# Patient Record
Sex: Male | Born: 1965 | Race: Black or African American | Hispanic: No | Smoking: Never smoker
Health system: Southern US, Community
[De-identification: ages and names within clinical notes are randomized; demographics above are authoritative.]

---

## 2018-12-27 ENCOUNTER — Other Ambulatory Visit: Payer: Self-pay

## 2018-12-27 ENCOUNTER — Emergency Department (HOSPITAL_COMMUNITY)
Admission: EM | Admit: 2018-12-27 | Discharge: 2018-12-27 | Disposition: A | Discharge status: Home / Self Care | Payer: Medicare Other | Attending: Emergency Medicine | Admitting: Emergency Medicine

## 2018-12-27 ENCOUNTER — Encounter (HOSPITAL_COMMUNITY): Payer: Self-pay | Admitting: Student

## 2018-12-27 ENCOUNTER — Emergency Department (HOSPITAL_COMMUNITY): Payer: Medicare Other

## 2018-12-27 DIAGNOSIS — R0981 Nasal congestion: Secondary | ICD-10-CM | POA: Diagnosis present

## 2018-12-27 DIAGNOSIS — J0100 Acute maxillary sinusitis, unspecified: Secondary | ICD-10-CM | POA: Diagnosis not present

## 2018-12-27 DIAGNOSIS — I10 Essential (primary) hypertension: Secondary | ICD-10-CM | POA: Diagnosis not present

## 2018-12-27 DIAGNOSIS — K5900 Constipation, unspecified: Secondary | ICD-10-CM

## 2018-12-27 DIAGNOSIS — E119 Type 2 diabetes mellitus without complications: Secondary | ICD-10-CM | POA: Insufficient documentation

## 2018-12-27 MED ORDER — POLYETHYLENE GLYCOL 3350 17 G PO PACK: 17.0000 g | PACK | Freq: Every day | ORAL | 0 refills | Status: AC

## 2018-12-27 MED ORDER — FLUTICASONE PROPIONATE 50 MCG/ACT NA SUSP: 1.0000 | Freq: Every day | NASAL | 0 refills | Status: DC

## 2018-12-27 MED ORDER — AMOXICILLIN-POT CLAVULANATE 875-125 MG PO TABS: 1.0000 | ORAL_TABLET | Freq: Two times a day (BID) | ORAL | 0 refills | Status: AC

## 2018-12-27 NOTE — ED Triage Notes (Signed)
PT reports sinus congestion and constipation. Pt reports he did an enema today and had a BM. Pt reports he has a lot of sinus pressure.

## 2018-12-27 NOTE — Discharge Instructions (Signed)
You were seen in the emergency department today for sinus congestion as well as constipation.  Your x-ray was reassuring.  Your symptoms may be more related to a virus or allergies, however we are covering you for possible bacterial sinusitis with an antibiotic, Augmentin, please take this as prescribed.  We are also sending home with Flonase to help with congestion.   Regarding constipation we are sending you home with MiraLAX to take as needed as well as diet guidelines.  We have prescribed you new medication(s) today. Discuss the medications prescribed today with your pharmacist as they can have adverse effects and interactions with your other medicines including over the counter and prescribed medications. Seek medical evaluation if you start to experience new or abnormal symptoms after taking one of these medicines, seek care immediately if you start to experience difficulty breathing, feeling of your throat closing, facial swelling, or rash as these could be indications of a more serious allergic reaction  Please be sure to take your blood pressure medication as prescribed and have it rechecked by your primary care provider as it was elevated in the ER.   Follow-up with primary care within 1 week, return to the ER for new or worsening symptoms or any other concerns.

## 2018-12-27 NOTE — ED Provider Notes (Signed)
[facility]-EMERGENCY DEPT Provider Note   CSN: [id number] Arrival date & time: 12/27/18  1413     History   Chief Complaint Chief Complaint  Patient presents with  . Nasal Congestion  . Constipation    HPI Christopher Park is a 53 y.o. male with a history of diabetes mellitus and hypertension who presents to the emergency department with complaints of sinus pain/pressure for the past 10 days.  Patient states he has had nasal congestion, rhinorrhea, and bilateral sinus pain/pressure.  His symptoms have been constant without alleviating or aggravating factors.  He tried over-the-counter Mucinex without relief.  He has had some mild coughing productive of yellow phlegm sputum and has subjectively felt warm but has not taken his temperature at home, denies fevers.  Denies ear pain, sore throat, chest pain, or dyspnea.  No known COVID-19 exposures.  He also mentions complaint of constipation, he states he is been having fairly small hard bowel movements recently, he utilized an enema today and had a fairly but normal bowel movement.  Other than enema no other alleviating or aggravating factors.  Denies nausea, vomiting, abdominal pain, melena, or hematochezia.  Patient did have high blood pressure in the emergency department, he did not take his blood pressure medicines this morning because he wanted to get his sinuses checked out.  Denies headache, change in vision, numbness, weakness, chest pain, dyspnea, or dizziness.     HPI  No past medical history on file.  There are no active problems to display for this patient.   History reviewed. No pertinent surgical history.    Home Medications    Prior to Admission medications   Not on File    Family History No family history on file.  Social History Social History   Tobacco Use  . Smoking status: Not on file  Substance Use Topics  . Alcohol use: Not on file  . Drug use: Not on file     Allergies    Patient has no allergy information on record.   Review of Systems Review of Systems  Constitutional: Negative for chills and fever.  HENT: Positive for congestion, sinus pressure and sinus pain. Negative for ear pain and sore throat.   Respiratory: Positive for cough. Negative for shortness of breath.   Cardiovascular: Negative for chest pain and leg swelling.  Gastrointestinal: Positive for constipation. Negative for abdominal pain, anal bleeding, blood in stool, diarrhea, nausea and vomiting.  Neurological: Negative for dizziness, syncope, weakness, light-headedness, numbness and headaches.  All other systems reviewed and are negative.    Physical Exam Updated Vital Signs BP (!) 197/104 (BP Location: Left Arm) Comment: Patient reports he did not take his BP meds today  Pulse 89   Temp 98.5 F (36.9 C) (Oral)   Resp 17   SpO2 100%   Physical Exam Vitals signs and nursing note reviewed.  Constitutional:      General: He is not in acute distress.    Appearance: He is well-developed. He is not toxic-appearing.  HENT:     Head: Normocephalic and atraumatic.     Right Ear: Tympanic membrane, ear canal and external ear normal.     Left Ear: Tympanic membrane, ear canal and external ear normal.     Nose: Congestion present.     Right Sinus: Maxillary sinus tenderness present. No frontal sinus tenderness.     Left Sinus: Maxillary sinus tenderness present. No frontal sinus tenderness.     Mouth/Throat:  Mouth: Mucous membranes are moist.     Pharynx: Uvula midline. No oropharyngeal exudate or posterior oropharyngeal erythema.     Comments: Posterior oropharynx is symmetric appearing. Patient tolerating own secretions without difficulty. No trismus. No drooling. No hot potato voice. No swelling beneath the tongue, submandibular compartment is soft.  Eyes:     General:        Right eye: No discharge.        Left eye: No discharge.     Extraocular Movements: Extraocular  movements intact.     Conjunctiva/sclera: Conjunctivae normal.     Pupils: Pupils are equal, round, and reactive to light.  Neck:     Musculoskeletal: Normal range of motion and neck supple. No neck rigidity.  Cardiovascular:     Rate and Rhythm: Normal rate and regular rhythm.  Pulmonary:     Effort: Pulmonary effort is normal. No respiratory distress.     Breath sounds: Normal breath sounds. No wheezing, rhonchi or rales.  Abdominal:     General: There is no distension.     Palpations: Abdomen is soft.     Tenderness: There is no abdominal tenderness. There is no guarding or rebound.  Lymphadenopathy:     Cervical: No cervical adenopathy.  Skin:    General: Skin is warm and dry.     Findings: No rash.  Neurological:     General: No focal deficit present.     Mental Status: He is alert.     Comments: Clear speech.   Psychiatric:        Behavior: Behavior normal.    ED Treatments / Results  Labs (all labs ordered are listed, but only abnormal results are displayed) Labs Reviewed - No data to display  EKG None  Radiology Dg Abd Acute W/chest  Result Date: 12/27/2018 CLINICAL DATA:  Sinus congestion and constipation. EXAM: DG ABDOMEN ACUTE W/ 1V CHEST COMPARISON:  None. FINDINGS: Single-view of the chest: Heart size and mediastinal contours are within normal limits. Lungs are clear. No pleural effusion or pneumothorax seen. Osseous structures about the chest are unremarkable. Supine and upright views of the abdomen: Bowel gas pattern is nonobstructive. Scattered nonspecific air-fluid levels, likely related to the given history of enema. No evidence of soft tissue mass or abnormal fluid collection. No evidence of free intraperitoneal air. No evidence of renal or ureteral calculi. Visualized osseous structures of the abdomen and pelvis are unremarkable. IMPRESSION: 1. No evidence of acute cardiopulmonary abnormality. No evidence of pneumonia or pulmonary edema. 2. Nonobstructive  bowel gas pattern and no evidence of acute intra-abdominal abnormality. No plain film evidence of constipation. Electronically Signed   By: Franki Cabot M.D.   On: 12/27/2018 15:27    Procedures Procedures (including critical care time)  Medications Ordered in ED Medications - No data to display   Initial Impression / Assessment and Plan / ED Course  I have reviewed the triage vital signs and the nursing notes.  Pertinent labs & imaging results that were available during my care of the patient were reviewed by me and considered in my medical decision making (see chart for details).   Patient presents to the emergency department with sinus pain/pressure and congestion for the past 10 days as well as constipation recently. He is nontoxic appearing, in no apparent distress, vitals WNL with the exception of his elevated BP- this is in the setting of not taking his antihypertensive medicine today, somewhat improved w/ repeat pressure, doubt HTN emergency, encouraged compliance  w/ medicine & PCP follow up.     In regards to his URI symptoms-feel this is more likely allergic/viral, however given symptoms for 10 days with sinus tenderness to palpation will cover for acute bacterial sinusitis with Augmentin, will also provide flonase.  Chest x-ray without infiltrate to suggest pneumonia.  No meningismus to suggest meningitis.  In regards to his constipation, last BM today, he is passing gas, abdomen soft/nontender, abdominal series x-ray reassuring- doubt perf/obstruction, or other acute surgical abdomen etiology. Will provide prescription for miralax.   I discussed results, treatment plan, need for follow-up, and return precautions with the patient. Provided opportunity for questions, patient confirmed understanding and is in agreement with plan.    Vitals:   12/27/18 1456 12/27/18 1552  BP: (!) 178/91 (!) 178/91  Pulse:  89  Resp:  18  Temp:  98.5 F (36.9 C)  SpO2:  100%     Final  Clinical Impressions(s) / ED Diagnoses   Final diagnoses:  Acute maxillary sinusitis, recurrence not specified  Constipation, unspecified constipation type  Hypertension, unspecified type    ED Discharge Orders         Ordered    amoxicillin-clavulanate (AUGMENTIN) 875-125 MG tablet  Every 12 hours     12/27/18 1548    fluticasone (FLONASE) 50 MCG/ACT nasal spray  Daily     12/27/18 1548    polyethylene glycol (MIRALAX) 17 g packet  Daily     12/27/18 [address]1548           , [place] CitySamantha R, PA-C 12/27/18 1604    Tegeler, Canary Brimhristopher J, MD 12/27/18 2249

## 2019-01-17 ENCOUNTER — Other Ambulatory Visit: Payer: Self-pay

## 2019-01-17 ENCOUNTER — Emergency Department (HOSPITAL_COMMUNITY)
Admission: EM | Admit: 2019-01-17 | Discharge: 2019-01-17 | Disposition: A | Discharge status: Home / Self Care | Payer: Medicare Other | Attending: Emergency Medicine | Admitting: Emergency Medicine

## 2019-01-17 ENCOUNTER — Encounter (HOSPITAL_COMMUNITY): Payer: Self-pay

## 2019-01-17 DIAGNOSIS — J309 Allergic rhinitis, unspecified: Secondary | ICD-10-CM | POA: Diagnosis not present

## 2019-01-17 DIAGNOSIS — J302 Other seasonal allergic rhinitis: Secondary | ICD-10-CM

## 2019-01-17 DIAGNOSIS — R0981 Nasal congestion: Secondary | ICD-10-CM | POA: Diagnosis present

## 2019-01-17 MED ORDER — OXYMETAZOLINE HCL 0.05 % NA SOLN
1.0000 | Freq: Once | NASAL | Status: AC
  Administered 2019-01-17: 13:00:00 1 via NASAL
  Filled 2019-01-17: qty 30

## 2019-01-17 MED ORDER — CETIRIZINE HCL 10 MG PO TABS: 10.0000 mg | ORAL_TABLET | Freq: Every day | ORAL | 1 refills | Status: AC

## 2019-01-17 MED ORDER — PREDNISONE 20 MG PO TABS: 40.0000 mg | ORAL_TABLET | Freq: Every day | ORAL | 0 refills | Status: AC

## 2019-01-17 MED ORDER — FLUTICASONE PROPIONATE 50 MCG/ACT NA SUSP: 1.0000 | Freq: Every day | NASAL | 0 refills | Status: AC

## 2019-01-17 NOTE — ED Triage Notes (Signed)
Pt states that he has congestion. Pt states that he has sinus pressure that keeps switching sides.

## 2019-01-17 NOTE — TOC Initial Note (Addendum)
Transition of Care [facility]) - Initial/Assessment Note    Patient Details  Name: Christopher Park MRN: [id number] Date of Birth: 1965/12/25  [facility]) CM/SW Contact:    Erenest Rasher, RN Phone Number: [phone] 01/17/2019, 2:20 PM  Clinical Narrative:       [facility] ED TOC CM -referral 2 ED visits within 2 weeks/No PCP           Spoke to pt and agreeable to [facility] CM arranging appt. Spoke to pt and his girlfriend, Systems developer via phone. Girlfriend states she goes to [place] on [address] and requesting appt at that provider. Appt arranged on 02/17/2019 at 1145 am.    01/18/2019 4:25 pm Received message from pt's girlfriend and they rescheduled his appt with Dr Nancy Fetter at [place] for 02/28/2019 at 53 am.   Expected Discharge Plan: Home/Self Care Barriers to Discharge: No Barriers Identified   Patient Goals and CMS Choice        Expected Discharge Plan and Services Expected Discharge Plan: Home/Self Care   Discharge Planning Services: CM Consult                                          Prior Living Arrangements/Services   Lives with:: Significant Other Patient language and need for interpreter reviewed:: Yes Do you feel safe going back to the place where you live?: Yes      Need for Family Participation in Patient Care: No (Comment) Care giver support system in place?: No (comment)   Criminal Activity/Legal Involvement Pertinent to Current Situation/Hospitalization: No - Comment as needed  Activities of Daily Living      Permission Sought/Granted Permission sought to share information with : Case Manager, PCP, Family Supports Permission granted to share information with : Yes, Verbal Permission Granted  Share Information with NAME: Ginette Pitman  Permission granted to share info w AGENCY: PCP  Permission granted to share info w Relationship: girlfriend  Permission granted to share info w Contact Information: [phone]  Emotional Assessment        Orientation: : Oriented to Self, Oriented to Place, Oriented to  Time, Oriented to Situation   Psych Involvement: No (comment)  Admission diagnosis:  sinus pain There are no active problems to display for this patient.  PCP:  Patient, No Pcp Per Pharmacy:   [id number] - [place], [place] [address] Phone: [phone] Fax: [phone]     Social Determinants of Health (SDOH) Interventions    Readmission Risk Interventions No flowsheet data found.

## 2019-01-17 NOTE — ED Provider Notes (Signed)
[facility]-EMERG[facility] DEPT Provider Note   CSN: [id number] Arrival date & time: 01/17/19  1011     History   Chief Complaint Chief Complaint  Patient presents with  . Nasal Congestion    HPI Christopher Park is a 53 y.o. male.     Patient is a 53 year old male without significant medical history presenting today with persistent nasal congestion and mucus running down the back of his throat.  Patient states symptoms have now been ongoing for approximately 1 month.  He was seen on 12/27/2018 and at that time was diagnosed with acute sinusitis.  Patient took a course of Augmentin and use the Flonase and has been taking an over-the-counter allergy medicine but states his symptoms are not improving.  He continues to have nasal congestion and drainage.  He denies fever, cough, shortness of breath.  He does suffer from allergies intermittently.  He states he used all the Flonase and is not using anything now.  He has some mild facial pain by his nose but no forehead pain or headache.  No visual changes.  The history is provided by the patient.    History reviewed. No pertinent past medical history.  There are no active problems to display for this patient.   History reviewed. No pertinent surgical history.      Home Medications    Prior to Admission medications   Medication Sig Start Date End Date Taking? Authorizing Provider  amoxicillin-clavulanate (AUGMENTIN) 875-125 MG tablet Take 1 tablet by mouth every 12 (twelve) hours. 12/27/18   Petrucelli, Samantha R, PA-C  fluticasone (FLONASE) 50 MCG/ACT nasal spray Place 1 spray into both nostrils daily. 12/27/18   Petrucelli, Samantha R, PA-C  polyethylene glycol (MIRALAX) 17 g packet Take 17 g by mouth daily. 12/27/18   Petrucelli, Pleas KochSamantha R, PA-C    Family History No family history on file.  Social History Social History   Tobacco Use  . Smoking status: Never Smoker  Substance Use Topics  . Alcohol use: Yes     Frequency: Never  . Drug use: Yes    Types: Marijuana     Allergies   Patient has no known allergies.   Review of Systems Review of Systems  All other systems reviewed and are negative.    Physical Exam Updated Vital Signs BP (!) 174/93 (BP Location: Left Arm)   Pulse (!) 58   Temp 98 F (36.7 C) (Oral)   Resp 14   Wt 98 kg   SpO2 100%   Physical Exam Vitals signs and nursing note reviewed.  Constitutional:      General: He is not in acute distress.    Appearance: He is well-developed and normal weight.  HENT:     Head: Normocephalic and atraumatic.     Right Ear: Tympanic membrane normal.     Left Ear: Tympanic membrane normal.     Nose: Mucosal edema and congestion present.     Right Sinus: Maxillary sinus tenderness present. No frontal sinus tenderness.     Left Sinus: Maxillary sinus tenderness present. No frontal sinus tenderness.     Mouth/Throat:     Pharynx: Oropharynx is clear. No pharyngeal swelling or posterior oropharyngeal erythema.  Eyes:     Conjunctiva/sclera: Conjunctivae normal.     Pupils: Pupils are equal, round, and reactive to light.  Neck:     Musculoskeletal: Normal range of motion and neck supple.  Cardiovascular:     Rate and Rhythm: Normal rate and regular  rhythm.     Pulses: Normal pulses.     Heart sounds: No murmur.  Pulmonary:     Effort: Pulmonary effort is normal. No respiratory distress.     Breath sounds: Normal breath sounds. No wheezing or rales.  Musculoskeletal: Normal range of motion.        General: No tenderness.  Skin:    General: Skin is warm and dry.     Capillary Refill: Capillary refill takes less than 2 seconds.     Findings: No erythema or rash.  Neurological:     Mental Status: He is alert and oriented to person, place, and time. Mental status is at baseline.  Psychiatric:        Mood and Affect: Mood normal.        Behavior: Behavior normal.        Thought Content: Thought content normal.       ED Treatments / Results  Labs (all labs ordered are listed, but only abnormal results are displayed) Labs Reviewed - No data to display  EKG None  Radiology No results found.  Procedures Procedures (including critical care time)  Medications Ordered in ED Medications  oxymetazoline (AFRIN) 0.05 % nasal spray 1 spray (has no administration in time range)     Initial Impression / Assessment and Plan / ED Course  I have reviewed the triage vital signs and the nursing notes.  Pertinent labs & imaging results that were available during my care of the patient were reviewed by me and considered in my medical decision making (see chart for details).       Patient presenting with symptoms consistent with persistent sinusitis.  He is not having any symptoms suggestive of bacterial sinusitis or infectious symptoms.  Recommended patient start on the nasal spray again and was given 3 days of Afrin.  Also patient given allergy medication and a short course of prednisone.  Low suspicion for COVID at this time.  Final Clinical Impressions(s) / ED Diagnoses   Final diagnoses:  Seasonal allergic rhinitis, unspecified trigger    ED Discharge Orders         Ordered    fluticasone (FLONASE) 50 MCG/ACT nasal spray  Daily     01/17/19 1215    predniSONE (DELTASONE) 20 MG tablet  Daily     01/17/19 1215    cetirizine (ZYRTEC) 10 MG tablet  Daily     01/17/19 1215           Blanchie Dessert, MD 01/17/19 1217

## 2019-01-17 NOTE — Discharge Instructions (Signed)
Use the Afrin 2 times a day for 3 days only and then stop.

## 2019-03-02 ENCOUNTER — Other Ambulatory Visit: Payer: Self-pay | Admitting: Otolaryngology

## 2019-03-02 DIAGNOSIS — J329 Chronic sinusitis, unspecified: Secondary | ICD-10-CM

## 2019-03-13 ENCOUNTER — Ambulatory Visit
Admission: RE | Admit: 2019-03-13 | Discharge: 2019-03-13 | Disposition: A | Discharge status: Home / Self Care | Payer: Medicare Other | Source: Ambulatory Visit | Attending: Otolaryngology | Admitting: Otolaryngology

## 2019-03-13 DIAGNOSIS — J329 Chronic sinusitis, unspecified: Secondary | ICD-10-CM

## 2019-07-24 ENCOUNTER — Ambulatory Visit: Payer: Medicare Other | Attending: Internal Medicine

## 2019-07-24 DIAGNOSIS — Z20822 Contact with and (suspected) exposure to covid-19: Secondary | ICD-10-CM

## 2019-07-25 LAB — NOVEL CORONAVIRUS, NAA: SARS-CoV-2, NAA: NOT DETECTED

## 2019-07-26 ENCOUNTER — Telehealth: Payer: Self-pay | Admitting: *Deleted

## 2019-07-26 NOTE — Telephone Encounter (Signed)
Patient called given negative covid results . 

## 2019-11-11 IMAGING — CT CT MAXILLOFACIAL W/O CM
3 of 5 series · 15 of 47 positions shown, 18 images · non-contrast
Comparison: No pertinent prior studies available for comparison.

CLINICAL DATA: Chronic sinusitis, unspecified location. Additional
history provided: Chronic sinusitis right-sided congestion and
facial pressure.

EXAM:
CT MAXILLOFACIAL WITHOUT CONTRAST
TECHNIQUE: Multidetector CT imaging of the maxillofacial structures was
performed. Multiplanar CT image reconstructions were also generated.

[Series 4: sinus 2.00 hr60 s3 cor · coronal · 0.24mm/px · 3 of 72 slices shown]
[im 24/72  bone]
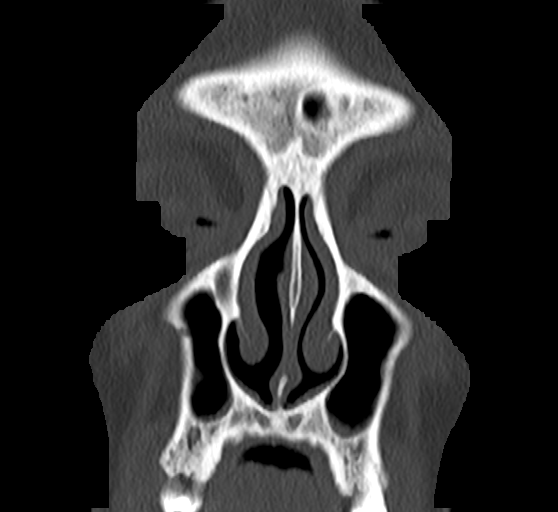
[im 32/72  bone]
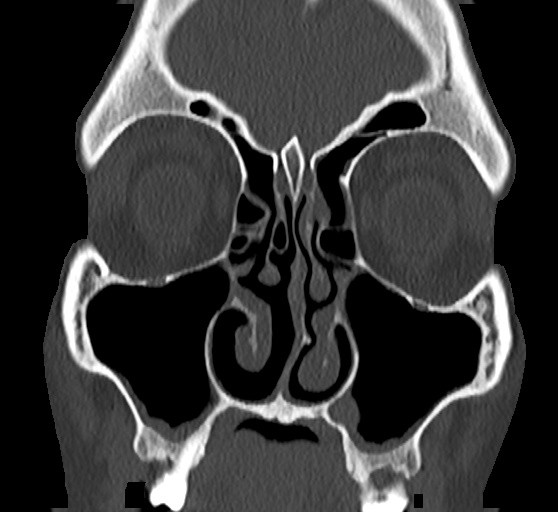
[im 40/72  bone]
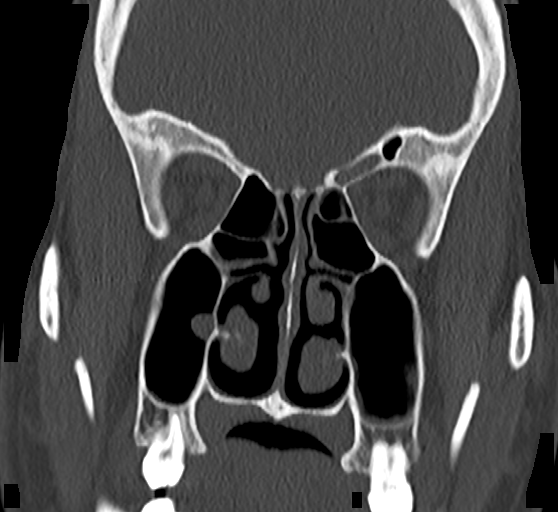

[Series 6: sinus 2.00 hr60 s3 sag · sagittal · 0.24mm/px · 3 of 68 slices shown]
[im 23/68  bone]
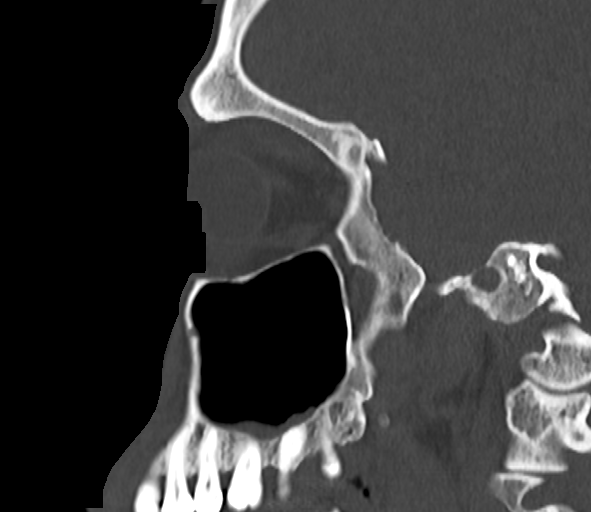
[im 34/68  bone]
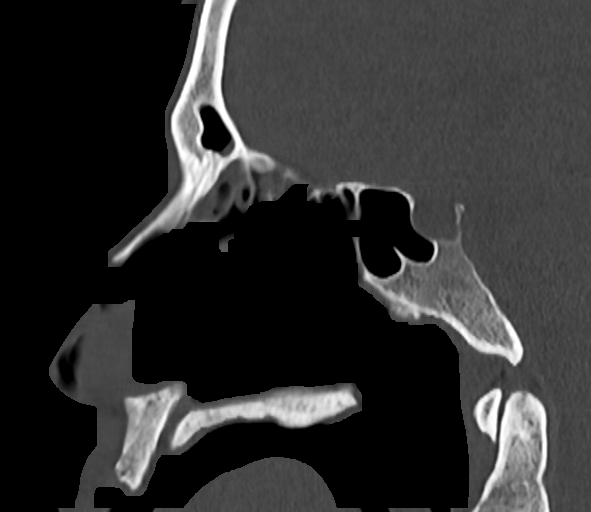
[im 45/68  bone]
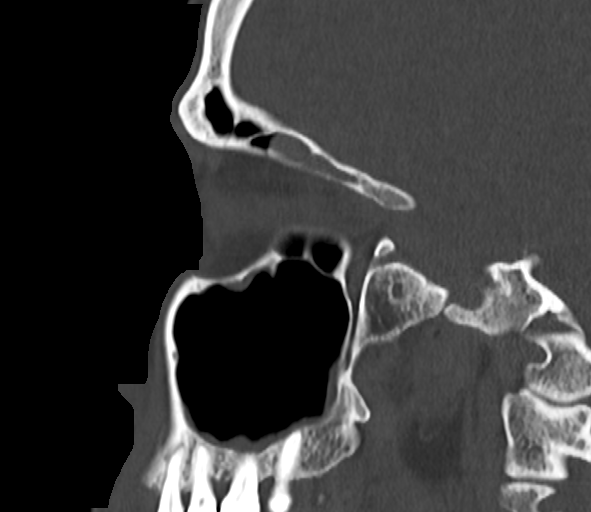

[Series 12: sinus 1.00 hr60 s3 axial fusion thins · axial · 0.30mm/px · z∈[-585,-475]mm · 9 of 207 slices shown, 12 images]
[im 12/207  brain]
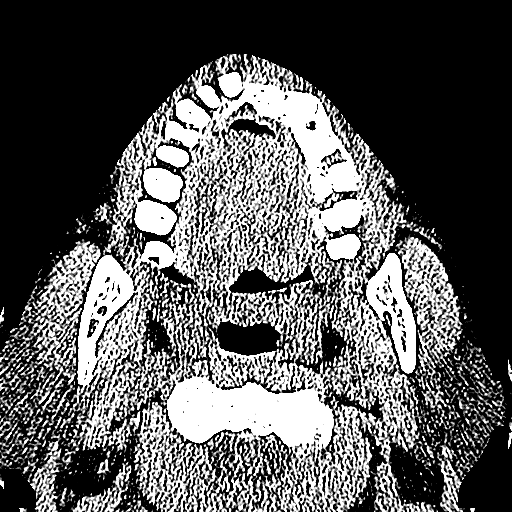
[im 12/207  bone]
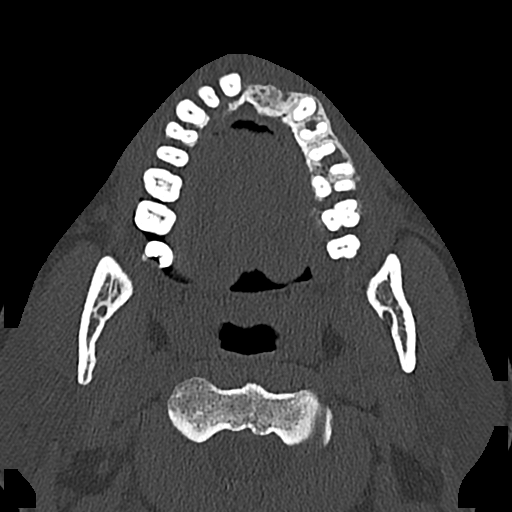
[im 35/207  bone]
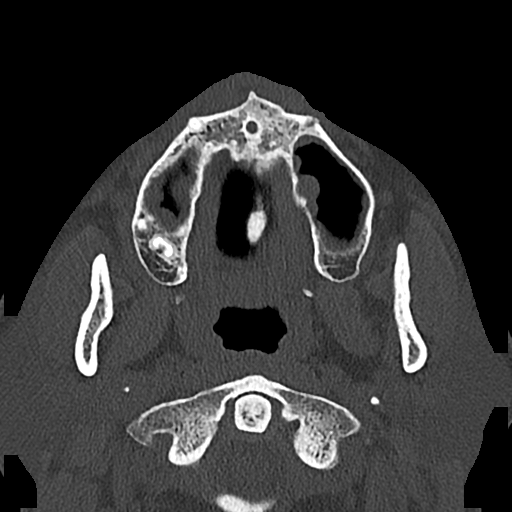
[im 58/207  bone]
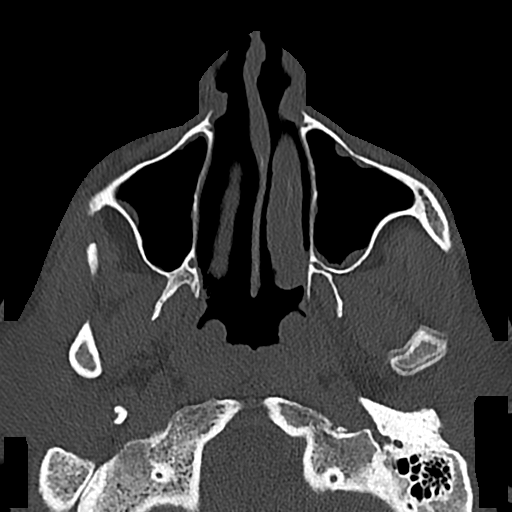
[im 81/207  bone]
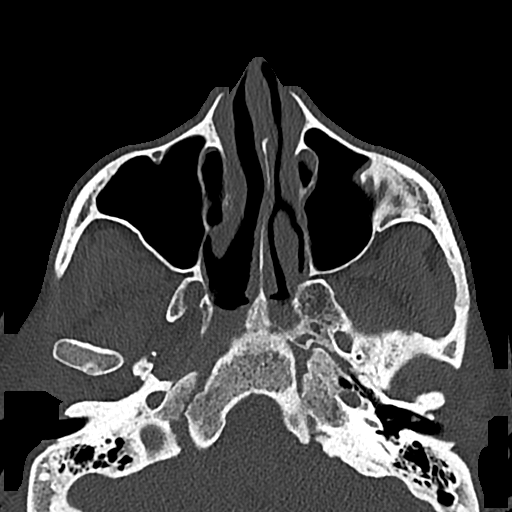
[im 104/207  brain]
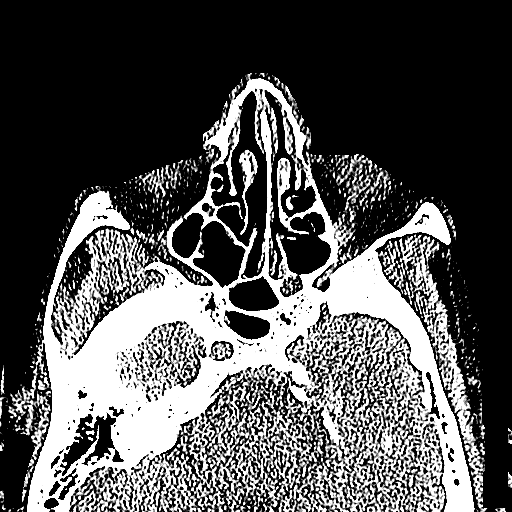
[im 104/207  bone]
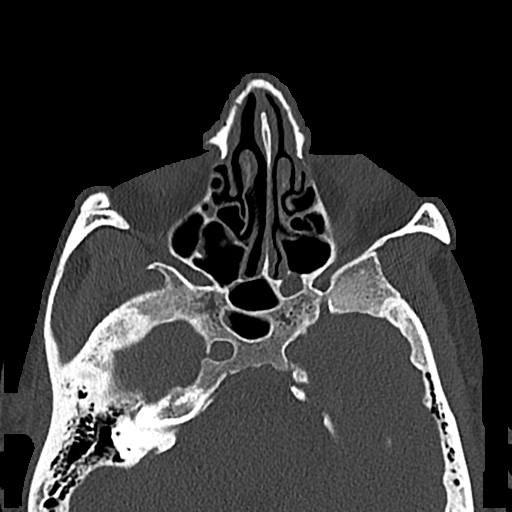
[im 126/207  bone]
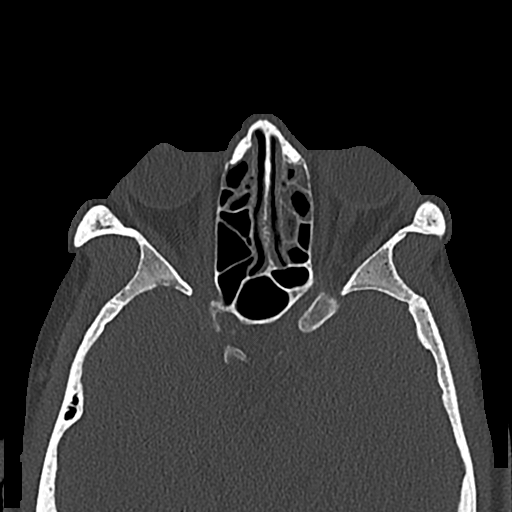
[im 149/207  bone]
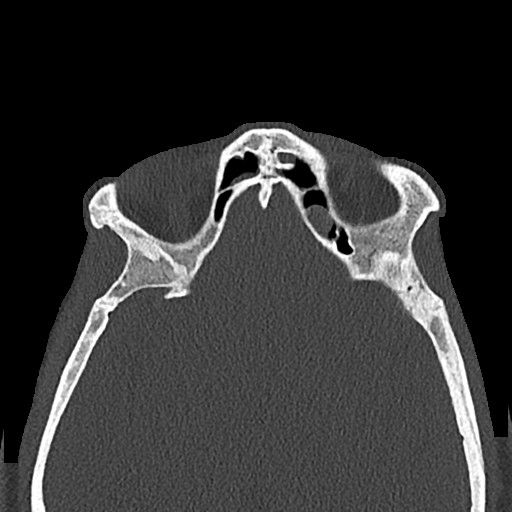
[im 172/207  bone]
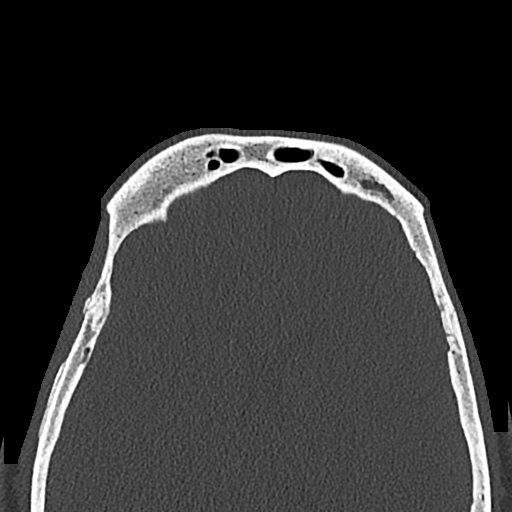
[im 195/207  brain]
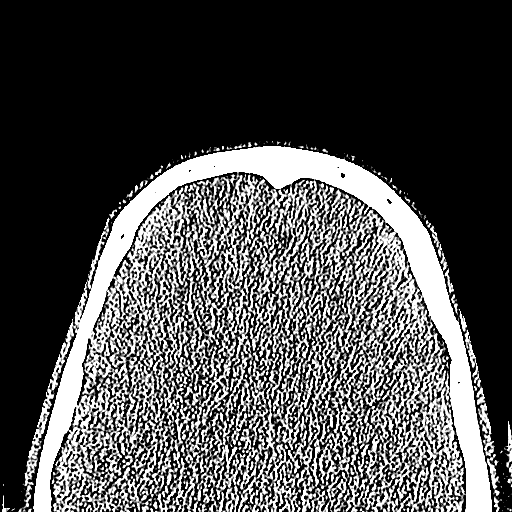
[im 195/207  bone]
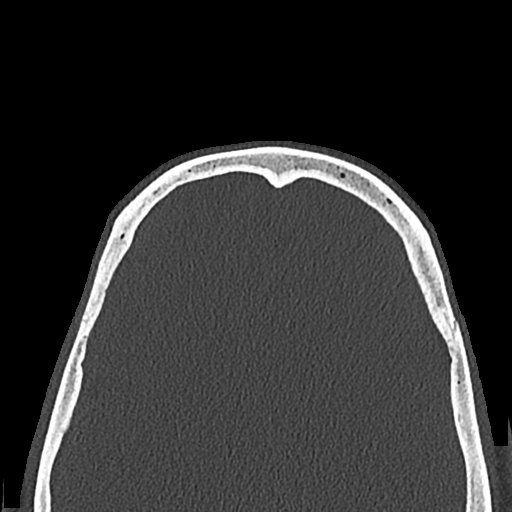

[15 of 47 positions shown; findings below may reference images not displayed]

FINDINGS: Osseous: No acute bony abnormality or suspicious osseous lesion.

Orbits: Imaged orbits unremarkable.

Sinuses:

Frontal sinuses: Mild polypoid mucosal thickening within dependent
portions of the left frontal sinus. There is also mild mucosal
thickening within the inferior left frontal sinus/frontal recess.
The right frontal sinus is well aerated. Partial opacification of
the right frontal recess

Ethmoid sinuses: Mild scattered ethmoid sinus mucosal thickening.

Sphenoid sinuses: Small left sphenoid sinus with mild mucosal
thickening and a small amount of frothy secretions. The left
sphenoid sinus ostium is partially obstructed. The left
sphenoethmoidal recess is narrowed by mucosal thickening. Only trace
mucosal thickening within the dominant right sphenoid sinus. The
right sphenoid sinus ostium is partially obstructed by mucosal
thickening and/or secretions. The right sphenoethmoidal recess is
patent.

Maxillary sinuses: Mild mucosal thickening within the right
maxillary sinus inferiorly. Small mucous retention cyst along the
medial wall of the right maxillary sinus. The right ostiomeatal unit
is patent. Mild mucosal thickening with small mucous retention cysts
in the left maxillary sinus. The left maxillary sinus ostium is
narrowed by mucosal thickening. The left ostiomeatal unit is
otherwise patent.

Leftward deviation of the bony nasal septum with mild leftward bony
spurring.

Soft tissues: Negative

Limited intracranial: Negative
IMPRESSION: Paranasal sinus disease as detailed within the body of the report.

Small amount of frothy secretions within the left sphenoid sinus.
Correlate for acute sinusitis. No air-fluid levels.

Bilateral maxillary sinus mucous retention cysts.

Leftward deviation of the bony nasal septum with mild leftward bony
spurring.

## 2022-02-08 ENCOUNTER — Other Ambulatory Visit: Payer: Self-pay

## 2022-02-08 ENCOUNTER — Emergency Department (HOSPITAL_COMMUNITY)
Admission: EM | Admit: 2022-02-08 | Discharge: 2022-02-08 | Disposition: A | Discharge status: Home / Self Care | Payer: Medicare Other | Attending: Emergency Medicine | Admitting: Emergency Medicine

## 2022-02-08 ENCOUNTER — Encounter (HOSPITAL_COMMUNITY): Payer: Self-pay | Admitting: Oncology

## 2022-02-08 ENCOUNTER — Emergency Department (HOSPITAL_COMMUNITY): Payer: Medicare Other

## 2022-02-08 DIAGNOSIS — E119 Type 2 diabetes mellitus without complications: Secondary | ICD-10-CM | POA: Insufficient documentation

## 2022-02-08 DIAGNOSIS — S3992XA Unspecified injury of lower back, initial encounter: Secondary | ICD-10-CM | POA: Diagnosis present

## 2022-02-08 DIAGNOSIS — S39012A Strain of muscle, fascia and tendon of lower back, initial encounter: Secondary | ICD-10-CM | POA: Diagnosis not present

## 2022-02-08 DIAGNOSIS — X58XXXA Exposure to other specified factors, initial encounter: Secondary | ICD-10-CM | POA: Insufficient documentation

## 2022-02-08 DIAGNOSIS — I10 Essential (primary) hypertension: Secondary | ICD-10-CM | POA: Insufficient documentation

## 2022-02-08 MED ORDER — LIDOCAINE 5 % EX PTCH
1.0000 | MEDICATED_PATCH | CUTANEOUS | Status: DC
  Administered 2022-02-08: 1 via TRANSDERMAL
  Filled 2022-02-08: qty 1

## 2022-02-08 MED ORDER — LIDOCAINE 5 % EX PTCH: 1.0000 | MEDICATED_PATCH | CUTANEOUS | 0 refills | Status: AC

## 2022-02-08 MED ORDER — METHOCARBAMOL 500 MG PO TABS: 500.0000 mg | ORAL_TABLET | Freq: Two times a day (BID) | ORAL | 0 refills | Status: AC

## 2022-02-08 MED ORDER — HYDROCODONE-ACETAMINOPHEN 5-325 MG PO TABS
1.0000 | ORAL_TABLET | Freq: Once | ORAL | Status: AC
  Administered 2022-02-08: 1 via ORAL
  Filled 2022-02-08: qty 1

## 2022-02-08 NOTE — ED Triage Notes (Signed)
Pt c/o left lower back pain x 3 weeks.

## 2022-02-08 NOTE — Discharge Instructions (Addendum)
You were seen in the ER for evaluation of your back pain. Your imaging does show some arthritis. I would like for you to follow up with your primary care doctor.  If you do not have one, I have included information for [place] and wellness group to this discharge paperwork.  If you were to have continued pain, would like for you to follow-up with [facility]. I have prescribed you lidocaine patches.  If your insurance does not cover these, you can pick them up over-the-counter.  I would like for you to take 600 ibuprofen every 6 hours as well as Tylenol 1000 mg every 6 hours.  Additionally, I am going to send you home with a muscle laxer.  Please do not drive or operate any heavy machinery while on this medication.  If you are going to consume alcohol, do not take these medications.  If any worsening pain, numbness, tingling, weakness, numbness in your groin or bottom region, trouble urinating, fevers, blood in your urine, black stools, temperature changes or any color changes, black feet follow-up the emergency department immediately for reevaluation.  Contact a health care provider if: Your back pain does not improve after 6 weeks of treatment. Your symptoms get worse. Get help right away if: Your back pain is severe. You are unable to stand or walk. You develop pain in your legs. You develop weakness in your buttocks or legs. You have difficulty controlling when you urinate or when you have a bowel movement. You have frequent, painful, or bloody urination. You have a temperature over 101.48F (38.3C)

## 2022-02-08 NOTE — ED Provider Notes (Signed)
[facility] Provider Note   CSN: [id number] Arrival date & time: 02/08/22  0623     History Chief Complaint  Patient presents with   Back Pain    Christopher Park is a 56 y.o. male.   Back Pain      Home Medications Prior to Admission medications   Medication Sig Start Date End Date Taking? Authorizing Provider  amoxicillin-clavulanate (AUGMENTIN) 875-125 MG tablet Take 1 tablet by mouth every 12 (twelve) hours. 12/27/18   Petrucelli, Samantha R, PA-C  cetirizine (ZYRTEC) 10 MG tablet Take 1 tablet (10 mg total) by mouth daily. 01/17/19   Gwyneth Sprout, MD  fluticasone (FLONASE) 50 MCG/ACT nasal spray Place 1 spray into both nostrils daily. 01/17/19   Gwyneth Sprout, MD  polyethylene glycol (MIRALAX) 17 g packet Take 17 g by mouth daily. 12/27/18   Petrucelli, Samantha R, PA-C  predniSONE (DELTASONE) 20 MG tablet Take 2 tablets (40 mg total) by mouth daily. 01/17/19   Gwyneth Sprout, MD      Allergies    Patient has no known allergies.    Review of Systems   Review of Systems  Musculoskeletal:  Positive for back pain.    Physical Exam Updated Vital Signs BP (!) 168/95 (BP Location: Left Arm)   Pulse 88   Temp 98 F (36.7 C) (Oral)   Resp 18   SpO2 100%  Physical Exam  ED Results / Procedures / Treatments   Labs (all labs ordered are listed, but only abnormal results are displayed) Labs Reviewed - No data to display  EKG None  Radiology DG Lumbar Spine Complete  Result Date: 02/08/2022 CLINICAL DATA:  Acute low back pain for 3 weeks.  Initial encounter. EXAM: LUMBAR SPINE - COMPLETE 4+ VIEW COMPARISON:  None Available. FINDINGS: Five non rib-bearing lumbar type vertebra are identified in normal alignment. There is no evidence of acute fracture or subluxation. Lumbar spine disc spaces are maintained. Degenerative disc disease/anterior spondylosis at T11-12 noted. There is no evidence of focal bony lesion or spondylolysis.  IMPRESSION: 1. Unremarkable lumbar spine. 2. 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 Electronically Signed   By: Harmon Pier M.D.   On: 02/08/2022 13:45    Procedures Procedures  {Document cardiac monitor, telemetry assessment procedure when appropriate:1}  Medications Ordered in ED Medications  lidocaine (LIDODERM) 5 % 1 patch (1 patch Transdermal Patch Applied 02/08/22 1326)  HYDROcodone-acetaminophen (NORCO/VICODIN) 5-325 MG per tablet 1 tablet (1 tablet Oral Given 02/08/22 1325)    ED Course/ Medical Decision Making/ A&P                           Medical Decision Making Amount and/or Complexity of Data Reviewed Radiology: ordered.  Risk Prescription drug management.   ***  I discussed this case with my attending physician who cosigned this note including patient's presenting symptoms, physical exam, and planned diagnostics and interventions. Attending physician stated agreement with plan or made changes to plan which were implemented.   Attending physician assessed patient at bedside.   {Document critical care time when appropriate:1} {Document review of labs and clinical decision tools ie heart score, Chads2Vasc2 etc:1}  {Document your independent review of radiology images, and any outside records:1} {Document your discussion with family members, caretakers, and with consultants:1} {Document social determinants of health affecting pt's care:1} {Document your decision making why or why not admission, treatments were needed:1} Final Clinical Impression(s) / ED Diagnoses Final diagnoses:  None    Rx / DC Orders ED Discharge Orders     None
